# Patient Record
Sex: Male | Born: 1990 | Race: Black or African American | Hispanic: No | Marital: Single | Smoking: Former smoker
Health system: Southern US, Community
[De-identification: ages and names within clinical notes are randomized; demographics above are authoritative.]

## PROBLEM LIST (undated history)

## (undated) DIAGNOSIS — S21339A Puncture wound without foreign body of unspecified front wall of thorax with penetration into thoracic cavity, initial encounter: Secondary | ICD-10-CM

## (undated) DIAGNOSIS — W3400XA Accidental discharge from unspecified firearms or gun, initial encounter: Secondary | ICD-10-CM

## (undated) DIAGNOSIS — F419 Anxiety disorder, unspecified: Secondary | ICD-10-CM

## (undated) HISTORY — DX: Puncture wound without foreign body of unspecified front wall of thorax with penetration into thoracic cavity, initial encounter: S21.339A

## (undated) HISTORY — DX: Anxiety disorder, unspecified: F41.9

## (undated) HISTORY — DX: Accidental discharge from unspecified firearms or gun, initial encounter: W34.00XA

---
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
## 2011-04-21 ENCOUNTER — Emergency Department (HOSPITAL_COMMUNITY): Payer: Medicaid Other

## 2011-04-21 ENCOUNTER — Inpatient Hospital Stay (HOSPITAL_COMMUNITY)
Admission: EM | Admit: 2011-04-21 | Discharge: 2011-05-02 | DRG: 164 | Disposition: A | Discharge status: Home / Self Care | Payer: Medicaid Other | Attending: General Surgery | Admitting: General Surgery

## 2011-04-21 ENCOUNTER — Inpatient Hospital Stay (HOSPITAL_COMMUNITY): Payer: Medicaid Other

## 2011-04-21 DIAGNOSIS — S270XXA Traumatic pneumothorax, initial encounter: Secondary | ICD-10-CM

## 2011-04-21 DIAGNOSIS — S21109A Unspecified open wound of unspecified front wall of thorax without penetration into thoracic cavity, initial encounter: Secondary | ICD-10-CM

## 2011-04-21 DIAGNOSIS — S41131A Puncture wound without foreign body of right upper arm, initial encounter: Secondary | ICD-10-CM

## 2011-04-21 DIAGNOSIS — D62 Acute posthemorrhagic anemia: Secondary | ICD-10-CM | POA: Diagnosis not present

## 2011-04-21 DIAGNOSIS — S51809A Unspecified open wound of unspecified forearm, initial encounter: Secondary | ICD-10-CM | POA: Diagnosis present

## 2011-04-21 DIAGNOSIS — R079 Chest pain, unspecified: Secondary | ICD-10-CM

## 2011-04-21 DIAGNOSIS — G8911 Acute pain due to trauma: Secondary | ICD-10-CM

## 2011-04-21 DIAGNOSIS — S2190XA Unspecified open wound of unspecified part of thorax, initial encounter: Principal | ICD-10-CM | POA: Diagnosis present

## 2011-04-21 DIAGNOSIS — S41109A Unspecified open wound of unspecified upper arm, initial encounter: Secondary | ICD-10-CM

## 2011-04-21 DIAGNOSIS — Y998 Other external cause status: Secondary | ICD-10-CM

## 2011-04-21 LAB — TYPE AND SCREEN
ABO/RH(D): O POS
Antibody Screen: NEGATIVE
Unit division: 0
Unit division: 0

## 2011-04-21 LAB — CBC
HCT: 31.3 % — ABNORMAL LOW (ref 39.0–52.0)
Hemoglobin: 10.3 g/dL — ABNORMAL LOW (ref 13.0–17.0)
MCH: 28.6 pg (ref 26.0–34.0)
MCHC: 32.9 g/dL (ref 30.0–36.0)
MCV: 86.9 fL (ref 78.0–100.0)
RDW: 14.3 % (ref 11.5–15.5)

## 2011-04-21 LAB — ABO/RH: ABO/RH(D): O POS

## 2011-04-21 LAB — URINALYSIS, ROUTINE W REFLEX MICROSCOPIC
Bilirubin Urine: NEGATIVE
Hgb urine dipstick: NEGATIVE
Ketones, ur: NEGATIVE mg/dL
Specific Gravity, Urine: 1.041 — ABNORMAL HIGH (ref 1.005–1.030)
Urobilinogen, UA: 0.2 mg/dL (ref 0.0–1.0)

## 2011-04-21 LAB — POCT I-STAT, CHEM 8
BUN: 9 mg/dL (ref 6–23)
Calcium, Ion: 1.08 mmol/L — ABNORMAL LOW (ref 1.12–1.32)
Chloride: 106 meq/L (ref 96–112)
Glucose, Bld: 127 mg/dL — ABNORMAL HIGH (ref 70–99)
TCO2: 18 mmol/L (ref 0–100)

## 2011-04-21 LAB — COMPREHENSIVE METABOLIC PANEL
ALT: 14 U/L (ref 0–53)
AST: 21 U/L (ref 0–37)
Alkaline Phosphatase: 76 U/L (ref 39–117)
CO2: 20 mEq/L (ref 19–32)
Calcium: 8.3 mg/dL — ABNORMAL LOW (ref 8.4–10.5)
Chloride: 107 mEq/L (ref 96–112)
GFR calc non Af Amer: >90 mL/min (ref >90)
Potassium: 2.6 mEq/L — CL (ref 3.5–5.1)
Sodium: 142 mEq/L (ref 135–145)
Total Bilirubin: 0.2 mg/dL — ABNORMAL LOW (ref 0.3–1.2)

## 2011-04-21 MED ORDER — IOHEXOL 300 MG/ML  SOLN
100.0000 mL | Freq: Once | INTRAMUSCULAR | Status: AC | PRN
  Administered 2011-04-21: 100 mL via INTRAVENOUS

## 2011-04-22 ENCOUNTER — Inpatient Hospital Stay (HOSPITAL_COMMUNITY): Payer: Medicaid Other

## 2011-04-22 LAB — CBC
HCT: 27.6 % — ABNORMAL LOW (ref 39.0–52.0)
MCV: 87.1 fL (ref 78.0–100.0)
RDW: 14.5 % (ref 11.5–15.5)
WBC: 8.5 10*3/uL (ref 4.0–10.5)

## 2011-04-23 ENCOUNTER — Inpatient Hospital Stay (HOSPITAL_COMMUNITY): Payer: Medicaid Other

## 2011-04-23 LAB — CBC
HCT: 27.1 % — ABNORMAL LOW (ref 39.0–52.0)
Hemoglobin: 9 g/dL — ABNORMAL LOW (ref 13.0–17.0)
MCH: 29.2 pg (ref 26.0–34.0)
MCHC: 33.2 g/dL (ref 30.0–36.0)

## 2011-04-24 ENCOUNTER — Inpatient Hospital Stay (HOSPITAL_COMMUNITY): Payer: Medicaid Other

## 2011-04-25 ENCOUNTER — Inpatient Hospital Stay (HOSPITAL_COMMUNITY): Payer: Medicaid Other

## 2011-04-25 LAB — CBC
MCH: 29.8 pg (ref 26.0–34.0)
MCV: 84.7 fL (ref 78.0–100.0)
Platelets: 350 10*3/uL (ref 150–400)
Platelets: 321 10*3/uL (ref 150–400)
RDW: 13.4 % (ref 11.5–15.5)
RDW: 13.4 % (ref 11.5–15.5)
WBC: 11.7 10*3/uL — ABNORMAL HIGH (ref 4.0–10.5)
WBC: 10.7 10*3/uL — ABNORMAL HIGH (ref 4.0–10.5)

## 2011-04-25 LAB — BASIC METABOLIC PANEL
Chloride: 88 mEq/L — ABNORMAL LOW (ref 96–112)
Creatinine, Ser: 0.73 mg/dL (ref 0.50–1.35)
GFR calc Af Amer: >90 mL/min (ref >90)
Potassium: 5.3 mEq/L — ABNORMAL HIGH (ref 3.5–5.1)
Sodium: 127 mEq/L — ABNORMAL LOW (ref 135–145)

## 2011-04-25 LAB — PROTIME-INR
INR: 1.1 (ref 0.00–1.49)
Prothrombin Time: 14.4 seconds (ref 11.6–15.2)

## 2011-04-25 LAB — APTT: aPTT: 37 seconds (ref 24–37)

## 2011-04-26 ENCOUNTER — Inpatient Hospital Stay (HOSPITAL_COMMUNITY): Payer: Medicaid Other

## 2011-04-26 DIAGNOSIS — J9600 Acute respiratory failure, unspecified whether with hypoxia or hypercapnia: Secondary | ICD-10-CM

## 2011-04-26 DIAGNOSIS — S27899A Unspecified injury of other specified intrathoracic organs, initial encounter: Secondary | ICD-10-CM

## 2011-04-26 DIAGNOSIS — S21309A Unspecified open wound of unspecified front wall of thorax with penetration into thoracic cavity, initial encounter: Secondary | ICD-10-CM

## 2011-04-26 HISTORY — PX: OTHER SURGICAL HISTORY: SHX169

## 2011-04-26 LAB — BASIC METABOLIC PANEL WITH GFR
BUN: 16 mg/dL (ref 6–23)
BUN: 16 mg/dL (ref 6–23)
CO2: 28 meq/L (ref 19–32)
CO2: 27 meq/L (ref 19–32)
Calcium: 9.9 mg/dL (ref 8.4–10.5)
Calcium: 9.6 mg/dL (ref 8.4–10.5)
Chloride: 87 meq/L — ABNORMAL LOW (ref 96–112)
Chloride: 89 meq/L — ABNORMAL LOW (ref 96–112)
Creatinine, Ser: 0.81 mg/dL (ref 0.50–1.35)
Creatinine, Ser: 0.8 mg/dL (ref 0.50–1.35)
GFR calc Af Amer: >90 mL/min
GFR calc Af Amer: >90 mL/min
GFR calc non Af Amer: >90 mL/min
GFR calc non Af Amer: >90 mL/min
Glucose, Bld: 124 mg/dL — ABNORMAL HIGH (ref 70–99)
Glucose, Bld: 121 mg/dL — ABNORMAL HIGH (ref 70–99)
Potassium: 6.7 meq/L (ref 3.5–5.1)
Potassium: 5.8 meq/L — ABNORMAL HIGH (ref 3.5–5.1)
Sodium: 124 meq/L — ABNORMAL LOW (ref 135–145)
Sodium: 126 meq/L — ABNORMAL LOW (ref 135–145)

## 2011-04-26 LAB — CBC
HCT: 29.4 % — ABNORMAL LOW (ref 39.0–52.0)
Hemoglobin: 10.4 g/dL — ABNORMAL LOW (ref 13.0–17.0)
RBC: 3.52 MIL/uL — ABNORMAL LOW (ref 4.22–5.81)
RDW: 13.4 % (ref 11.5–15.5)
WBC: 9.9 10*3/uL (ref 4.0–10.5)

## 2011-04-26 LAB — POCT I-STAT 4, (NA,K, GLUC, HGB,HCT)
Glucose, Bld: 130 mg/dL — ABNORMAL HIGH (ref 70–99)
HCT: 25 % — ABNORMAL LOW (ref 39.0–52.0)
Hemoglobin: 8.5 g/dL — ABNORMAL LOW (ref 13.0–17.0)
Potassium: 5.2 meq/L — ABNORMAL HIGH (ref 3.5–5.1)
Sodium: 129 meq/L — ABNORMAL LOW (ref 135–145)

## 2011-04-26 LAB — POTASSIUM: Potassium: 4.9 mEq/L (ref 3.5–5.1)

## 2011-04-27 ENCOUNTER — Inpatient Hospital Stay (HOSPITAL_COMMUNITY): Payer: Medicaid Other

## 2011-04-27 DIAGNOSIS — S272XXA Traumatic hemopneumothorax, initial encounter: Secondary | ICD-10-CM | POA: Diagnosis present

## 2011-04-27 DIAGNOSIS — S2190XA Unspecified open wound of unspecified part of thorax, initial encounter: Secondary | ICD-10-CM | POA: Diagnosis present

## 2011-04-27 LAB — CBC
HCT: 23.7 % — ABNORMAL LOW (ref 39.0–52.0)
Hemoglobin: 8.1 g/dL — ABNORMAL LOW (ref 13.0–17.0)
MCH: 28.4 pg (ref 26.0–34.0)
MCV: 83.2 fL (ref 78.0–100.0)
Platelets: 306 10*3/uL (ref 150–400)
RBC: 2.85 MIL/uL — ABNORMAL LOW (ref 4.22–5.81)
WBC: 13.2 10*3/uL — ABNORMAL HIGH (ref 4.0–10.5)

## 2011-04-27 LAB — BASIC METABOLIC PANEL
BUN: 6 mg/dL (ref 6–23)
CO2: 26 mEq/L (ref 19–32)
Calcium: 8.4 mg/dL (ref 8.4–10.5)
Chloride: 91 mEq/L — ABNORMAL LOW (ref 96–112)
Creatinine, Ser: 0.57 mg/dL (ref 0.50–1.35)
Glucose, Bld: 138 mg/dL — ABNORMAL HIGH (ref 70–99)

## 2011-04-27 LAB — POCT I-STAT 3, ART BLOOD GAS (G3+)
Acid-Base Excess: 3 mmol/L — ABNORMAL HIGH (ref 0.0–2.0)
Bicarbonate: 27.9 meq/L — ABNORMAL HIGH (ref 20.0–24.0)
pCO2 arterial: 43.3 mmHg (ref 35.0–45.0)
pO2, Arterial: 124 mmHg — ABNORMAL HIGH (ref 80.0–100.0)

## 2011-04-27 MED ORDER — ALBUTEROL SULFATE HFA 108 (90 BASE) MCG/ACT IN AERS
4.0000 | INHALATION_SPRAY | Freq: Four times a day (QID) | RESPIRATORY_TRACT | Status: AC
  Administered 2011-04-28 (×2): 4 via RESPIRATORY_TRACT

## 2011-04-27 MED ORDER — SODIUM CHLORIDE 0.9 % IJ SOLN
10.0000 mL | Freq: Two times a day (BID) | INTRAMUSCULAR | Status: DC
  Administered 2011-04-28: 10 mL via INTRAVENOUS
  Administered 2011-04-29: 3 mL via INTRAVENOUS
  Administered 2011-04-29: 10 mL via INTRAVENOUS
  Administered 2011-04-30 – 2011-05-01 (×3): 3 mL via INTRAVENOUS
  Administered 2011-05-01: 10 mL via INTRAVENOUS
  Filled 2011-04-27 (×4): qty 10

## 2011-04-27 MED ORDER — PANTOPRAZOLE SODIUM 40 MG IV SOLR: 40.0000 mg | Freq: Every day | INTRAVENOUS | Status: DC | PRN

## 2011-04-27 MED ORDER — HYDROMORPHONE 0.3 MG/ML IV SOLN: INTRAVENOUS | Status: DC

## 2011-04-27 MED ORDER — ONDANSETRON HCL 4 MG/2ML IJ SOLN: 4.0000 mg | Freq: Four times a day (QID) | INTRAMUSCULAR | Status: DC | PRN

## 2011-04-27 MED ORDER — FENTANYL 10 MCG/ML IV SOLN
INTRAVENOUS | Status: DC
  Administered 2011-04-28: 80 ug via INTRAVENOUS
  Administered 2011-04-28: 11:00:00 via INTRAVENOUS
  Administered 2011-04-28: 50 ug via INTRAVENOUS
  Administered 2011-04-28: 70 ug via INTRAVENOUS
  Administered 2011-04-28: 30 ug via INTRAVENOUS
  Administered 2011-04-28: 40 ug via INTRAVENOUS
  Administered 2011-04-29: 90 ug via INTRAVENOUS
  Administered 2011-04-29: 05:00:00 via INTRAVENOUS
  Administered 2011-04-29: 190 ug via INTRAVENOUS
  Filled 2011-04-27 (×3): qty 50

## 2011-04-27 MED ORDER — ACETAMINOPHEN 10 MG/ML IV SOLN
1000.0000 mg | Freq: Four times a day (QID) | INTRAVENOUS | Status: AC
  Administered 2011-04-28: 1000 mg via INTRAVENOUS
  Filled 2011-04-27 (×4): qty 100

## 2011-04-27 MED ORDER — SODIUM CHLORIDE 0.9 % IJ SOLN: 10.0000 mL | INTRAMUSCULAR | Status: DC | PRN

## 2011-04-27 MED ORDER — TRAMADOL HCL 50 MG PO TABS: 50.0000 mg | ORAL_TABLET | Freq: Four times a day (QID) | ORAL | Status: DC | PRN

## 2011-04-27 MED ORDER — ONDANSETRON HCL 4 MG PO TABS: 4.0000 mg | ORAL_TABLET | Freq: Four times a day (QID) | ORAL | Status: DC | PRN

## 2011-04-27 MED ORDER — MORPHINE SULFATE 2 MG/ML IJ SOLN: 2.0000 mg | INTRAMUSCULAR | Status: DC | PRN

## 2011-04-27 MED ORDER — SODIUM CHLORIDE 0.9 % IV SOLN: INTRAVENOUS | Status: DC

## 2011-04-27 MED ORDER — POTASSIUM CHLORIDE 10 MEQ/50ML IV SOLN
10.0000 meq | INTRAVENOUS | Status: DC | PRN
  Filled 2011-04-27: qty 50

## 2011-04-27 MED ORDER — NALOXONE HCL 0.4 MG/ML IJ SOLN: 0.4000 mg | INTRAMUSCULAR | Status: DC | PRN

## 2011-04-27 MED ORDER — ACETAMINOPHEN 325 MG PO TABS: 650.0000 mg | ORAL_TABLET | ORAL | Status: DC | PRN

## 2011-04-27 MED ORDER — OXYCODONE HCL 5 MG PO TABS
5.0000 mg | ORAL_TABLET | Freq: Four times a day (QID) | ORAL | Status: DC | PRN
  Administered 2011-04-28 – 2011-04-29 (×3): 5 mg via ORAL
  Filled 2011-04-27 (×2): qty 1

## 2011-04-27 MED ORDER — POLYSACCHARIDE IRON 150 MG PO CAPS
150.0000 mg | ORAL_CAPSULE | Freq: Every day | ORAL | Status: DC
  Administered 2011-04-28 – 2011-05-01 (×5): 150 mg via ORAL
  Filled 2011-04-27 (×5): qty 1

## 2011-04-27 MED ORDER — PANTOPRAZOLE SODIUM 40 MG PO TBEC
40.0000 mg | DELAYED_RELEASE_TABLET | Freq: Every day | ORAL | Status: DC
  Administered 2011-04-28 – 2011-04-29 (×2): 40 mg via ORAL

## 2011-04-27 MED ORDER — ACETAMINOPHEN 325 MG PO TABS: 650.0000 mg | ORAL_TABLET | Freq: Four times a day (QID) | ORAL | Status: DC | PRN

## 2011-04-27 MED ORDER — BACITRACIN-NEOMYCIN-POLYMYXIN OINTMENT TUBE
TOPICAL_OINTMENT | Freq: Every day | CUTANEOUS | Status: DC
  Administered 2011-04-28 – 2011-05-01 (×4): via TOPICAL
  Filled 2011-04-27 (×3): qty 15

## 2011-04-27 MED ORDER — SODIUM CHLORIDE 0.9 % IJ SOLN: 9.0000 mL | INTRAMUSCULAR | Status: DC | PRN

## 2011-04-27 MED ORDER — CELECOXIB 200 MG PO CAPS
200.0000 mg | ORAL_CAPSULE | Freq: Two times a day (BID) | ORAL | Status: DC
  Administered 2011-04-28 – 2011-05-01 (×8): 200 mg via ORAL
  Filled 2011-04-27 (×12): qty 1

## 2011-04-27 MED ORDER — SENNOSIDES-DOCUSATE SODIUM 8.6-50 MG PO TABS
1.0000 | ORAL_TABLET | Freq: Every evening | ORAL | Status: DC | PRN
  Administered 2011-05-01: 1 via ORAL
  Filled 2011-04-27: qty 1

## 2011-04-27 MED ORDER — HYDROCODONE-ACETAMINOPHEN 5-325 MG PO TABS
1.0000 | ORAL_TABLET | ORAL | Status: DC | PRN
  Administered 2011-04-28 (×2): 1 via ORAL
  Administered 2011-04-28: 2 via ORAL
  Administered 2011-04-29 (×2): 1 via ORAL
  Administered 2011-04-29 – 2011-05-02 (×14): 2 via ORAL
  Filled 2011-04-27 (×5): qty 2
  Filled 2011-04-27: qty 1
  Filled 2011-04-27: qty 2
  Filled 2011-04-27: qty 1
  Filled 2011-04-27 (×4): qty 2

## 2011-04-27 MED ORDER — DIPHENHYDRAMINE HCL 25 MG PO CAPS: 25.0000 mg | ORAL_CAPSULE | Freq: Four times a day (QID) | ORAL | Status: DC | PRN

## 2011-04-27 MED ORDER — DEXTROSE-NACL 5-0.9 % IV SOLN: INTRAVENOUS | Status: DC

## 2011-04-27 MED ORDER — DOCUSATE SODIUM 100 MG PO CAPS
100.0000 mg | ORAL_CAPSULE | Freq: Two times a day (BID) | ORAL | Status: DC
  Administered 2011-04-28 – 2011-05-01 (×6): 100 mg via ORAL
  Filled 2011-04-27 (×7): qty 1

## 2011-04-27 MED ORDER — BISACODYL 5 MG PO TBEC
10.0000 mg | DELAYED_RELEASE_TABLET | Freq: Every day | ORAL | Status: DC
  Administered 2011-04-28 – 2011-05-01 (×4): 10 mg via ORAL
  Filled 2011-04-27 (×2): qty 2

## 2011-04-27 MED ORDER — DIPHENHYDRAMINE HCL 12.5 MG/5ML PO ELIX
25.0000 mg | ORAL_SOLUTION | Freq: Four times a day (QID) | ORAL | Status: DC | PRN
  Filled 2011-04-27: qty 10

## 2011-04-27 MED ORDER — FENTANYL 10 MCG/ML IV SOLN
INTRAVENOUS | Status: DC
  Filled 2011-04-27: qty 50

## 2011-04-27 MED ORDER — METOPROLOL TARTRATE 12.5 MG HALF TABLET
12.5000 mg | ORAL_TABLET | Freq: Two times a day (BID) | ORAL | Status: DC
  Administered 2011-04-28 – 2011-05-01 (×8): 12.5 mg via ORAL
  Filled 2011-04-27: qty 0.5
  Filled 2011-04-27: qty 1
  Filled 2011-04-27: qty 0.5
  Filled 2011-04-27: qty 1
  Filled 2011-04-27: qty 0.5
  Filled 2011-04-27: qty 1
  Filled 2011-04-27: qty 0.5
  Filled 2011-04-27 (×4): qty 1
  Filled 2011-04-27: qty 0.5

## 2011-04-27 MED ORDER — DEXTROSE 5 % IV SOLN
1.5000 g | Freq: Two times a day (BID) | INTRAVENOUS | Status: DC
  Administered 2011-04-28 – 2011-04-29 (×3): 1.5 g via INTRAVENOUS
  Filled 2011-04-27 (×3): qty 1.5

## 2011-04-27 MED ORDER — DIPHENHYDRAMINE HCL 50 MG/ML IJ SOLN
12.5000 mg | Freq: Four times a day (QID) | INTRAMUSCULAR | Status: DC | PRN
  Administered 2011-04-28: 25 mg via INTRAVENOUS
  Filled 2011-04-27: qty 1

## 2011-04-27 MED ORDER — DEXTROSE-NACL 5-0.9 % IV SOLN
INTRAVENOUS | Status: DC
  Administered 2011-04-28 (×3): via INTRAVENOUS
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
## 2011-04-28 ENCOUNTER — Inpatient Hospital Stay (HOSPITAL_COMMUNITY): Payer: Medicaid Other

## 2011-04-28 LAB — CBC
MCHC: 33.8 g/dL (ref 30.0–36.0)
Platelets: 384 10*3/uL (ref 150–400)
RDW: 13.8 % (ref 11.5–15.5)
WBC: 11.1 10*3/uL — ABNORMAL HIGH (ref 4.0–10.5)

## 2011-04-28 LAB — COMPREHENSIVE METABOLIC PANEL
ALT: 25 U/L (ref 0–53)
AST: 57 U/L — ABNORMAL HIGH (ref 0–37)
Albumin: 2.8 g/dL — ABNORMAL LOW (ref 3.5–5.2)
Alkaline Phosphatase: 75 U/L (ref 39–117)
Potassium: 4.3 mEq/L (ref 3.5–5.1)
Sodium: 132 mEq/L — ABNORMAL LOW (ref 135–145)
Total Protein: 7.7 g/dL (ref 6.0–8.3)
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
## 2011-04-28 NOTE — Progress Notes (Signed)
                                                  Subjective: The patient feels much better today. He has no air leak. Chest x-ray shows lung is expanded. He is going small column. He is less nauseated today. We plan to transfer him today and discontinue one chest.  Objective: Vital signs in last 24 hours: Temp:  [98.6 F (37 C)-99 F (37.2 C)] 98.6 F (37 C) (11/04 0400) Cardiac Rhythm:  [-] Normal sinus rhythm (11/04 0600) SpO2:  [100 %] 100 % (11/04 0815) Weight:  [137 lb 9.1 oz (62.4 kg)] 137 lb 9.1 oz (62.4 kg) (11/04 0600)  Hemodynamic parameters for last 24 hours:    Intake/Output from previous day: 11/03 0701 - 11/04 0700 In: 743 [P.O.:180; I.V.:563] Out: 930 [Urine:880; Chest Tube:50] Intake/Output this shift:    Heart: regular rate and rhythm, S1, S2 normal, no murmur, click, rub or gallop Lungs: clear to auscultation bilaterally  Lab Results:  [facility] 04/28/11 0340 04/27/11 0344  WBC 11.1* 13.2*  HGB 8.9* 8.1*  HCT 26.3* 23.7*  PLT 384 306   BMET:  Basename 04/28/11 0340 04/27/11 0344  NA 132* 126*  K 4.3 4.4  CL 95* 91*  CO2 28 26  GLUCOSE 94 138*  BUN 9 6  CREATININE 0.63 0.57  CALCIUM 9.4 8.4    PT/INR:  Basename 04/25/11 1708  LABPROT 14.4  INR 1.10   ABG    Component Value Date/Time   PHART 7.416 04/27/2011 0417   HCO3 27.9* 04/27/2011 0417   TCO2 29 04/27/2011 0417   O2SAT 99.0 04/27/2011 0417   CBG (last 3)  No results found for this basename: GLUCAP:3 in the last 72 hours  Assessment/Plan: S/P   The patient will be transferred today. We will remove one chest tube. We removed his on Q.   LOS: 7 days    Batsheva Stevick [facility] 04/28/2011

## 2011-04-28 NOTE — Plan of Care (Signed)
Problem: Phase I Progression Outcomes Goal: If Diabetic, blood sugar < 150 Outcome: Not Applicable Date Met:  04/28/11 NA

## 2011-04-29 ENCOUNTER — Inpatient Hospital Stay (HOSPITAL_COMMUNITY): Payer: Medicaid Other

## 2011-04-29 LAB — TYPE AND SCREEN

## 2011-04-29 LAB — CBC
HCT: 25.7 % — ABNORMAL LOW (ref 39.0–52.0)
Hemoglobin: 8.7 g/dL — ABNORMAL LOW (ref 13.0–17.0)
MCH: 28.4 pg (ref 26.0–34.0)
MCHC: 33.9 g/dL (ref 30.0–36.0)
MCV: 84 fL (ref 78.0–100.0)
Platelets: 529 10*3/uL — ABNORMAL HIGH (ref 150–400)
RBC: 3.06 MIL/uL — ABNORMAL LOW (ref 4.22–5.81)
RDW: 14 % (ref 11.5–15.5)
WBC: 11.8 10*3/uL — ABNORMAL HIGH (ref 4.0–10.5)

## 2011-04-29 LAB — CULTURE, RESPIRATORY W GRAM STAIN
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
## 2011-04-29 NOTE — Progress Notes (Signed)
                                                  Subjective: The patient had a temp of 101 last night. Patient has no air leak. Chest x-ray shows a apical space. He is doing well overall. I plan to start antibiotics. I will try and transfer him again.  Objective: Vital signs in last 24 hours: Temp:  [98.4 F (36.9 C)-101.1 F (38.4 C)] 98.4 F (36.9 C) (11/05 0800) Pulse Rate:  [96-119] 111  (11/05 0800) Cardiac Rhythm:  [-] Sinus tachycardia (11/05 0800) Resp:  [11-21] 17  (11/05 0800) BP: (108-150)/(65-103) 126/73 mmHg (11/05 0800) SpO2:  [92 %-100 %] 97 % (11/05 0800) Weight:  [135 lb 9.3 oz (61.5 kg)] 135 lb 9.3 oz (61.5 kg) (11/05 0600)  Hemodynamic parameters for last 24 hours:    Intake/Output from previous day: 11/04 0701 - 11/05 0700 In: 2135 [P.O.:1450; I.V.:585; IV Piggyback:100] Out: 1540 [Urine:1000; Chest Tube:540] Intake/Output this shift:    Heart: regular rate and rhythm, S1, S2 normal, no murmur, click, rub or gallop Lungs: clear to auscultation bilaterally  Lab Results:  Basename 04/29/11 0600 04/28/11 0340  WBC 11.8* 11.1*  HGB 8.7* 8.9*  HCT 25.7* 26.3*  PLT 529* 384   BMET:  Basename 04/28/11 0340 04/27/11 0344  NA 132* 126*  K 4.3 4.4  CL 95* 91*  CO2 28 26  GLUCOSE 94 138*  BUN 9 6  CREATININE 0.63 0.57  CALCIUM 9.4 8.4    PT/INR: No results found for this basename: LABPROT,INR in the last 72 hours ABG    Component Value Date/Time   PHART 7.416 04/27/2011 0417   HCO3 27.9* 04/27/2011 0417   TCO2 29 04/27/2011 0417   O2SAT 99.0 04/27/2011 0417   CBG (last 3)  No results found for this basename: GLUCAP:3 in the last 72 hours  Assessment/Plan: S/P   Plan for transfer to step-down: see transfer orders. I will start antibiotics.   LOS: 8 days    Ocie Stanzione PAT[facility] 04/29/2011

## 2011-04-29 NOTE — Progress Notes (Signed)
Transferred to 2041 via wheelchair.  Portable monitor on.  No changes.

## 2011-04-30 ENCOUNTER — Inpatient Hospital Stay (HOSPITAL_COMMUNITY): Payer: Medicaid Other

## 2011-04-30 DIAGNOSIS — D62 Acute posthemorrhagic anemia: Secondary | ICD-10-CM | POA: Diagnosis not present

## 2011-04-30 LAB — URINALYSIS, ROUTINE W REFLEX MICROSCOPIC
Glucose, UA: NEGATIVE mg/dL
Hgb urine dipstick: NEGATIVE
Protein, ur: 30 mg/dL — AB
Specific Gravity, Urine: 1.039 — ABNORMAL HIGH (ref 1.005–1.030)

## 2011-04-30 LAB — BASIC METABOLIC PANEL
BUN: 9 mg/dL (ref 6–23)
Calcium: 9.4 mg/dL (ref 8.4–10.5)
Creatinine, Ser: 0.57 mg/dL (ref 0.50–1.35)
GFR calc Af Amer: >90 mL/min (ref >90)
GFR calc non Af Amer: >90 mL/min (ref >90)

## 2011-04-30 LAB — URINE MICROSCOPIC-ADD ON

## 2011-04-30 LAB — CBC
MCHC: 33.8 g/dL (ref 30.0–36.0)
RDW: 13.7 % (ref 11.5–15.5)

## 2011-04-30 MED ORDER — MORPHINE SULFATE 2 MG/ML IJ SOLN: 2.0000 mg | INTRAMUSCULAR | Status: DC | PRN

## 2011-04-30 MED ORDER — POLYETHYLENE GLYCOL 3350 17 G PO PACK
17.0000 g | PACK | Freq: Every day | ORAL | Status: DC
  Administered 2011-05-01: 17 g via ORAL
  Filled 2011-04-30 (×3): qty 1
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
## 2011-04-30 NOTE — Progress Notes (Signed)
Hgb=7.1 Dr. Edwyna Shell aware.

## 2011-04-30 NOTE — Progress Notes (Signed)
                                                  Subjective: The patient's incision is well healed. Chest x-ray shows and the lungs expanded. He has no air leak. Plan to remove chest tube. We will discharge tomorrow if stable. Hematocrit was 23. Patient is on iron.  Objective: Vital signs in last 24 hours: Temp:  [98.4 F (36.9 C)-99.2 F (37.3 C)] 98.4 F (36.9 C) (11/06 0500) Pulse Rate:  [82-104] 82  (11/06 0500) Cardiac Rhythm:  [-] Normal sinus rhythm (11/06 0700) Resp:  [14-18] 14  (11/06 0500) BP: (118-126)/(61-74) 126/61 mmHg (11/06 0500) SpO2:  [93 %-97 %] 97 % (11/06 0500)  Hemodynamic parameters for last 24 hours:    Intake/Output from previous day: 11/05 0701 - 11/06 0700 In: 651 [P.O.:600; I.V.:51] Out: 1465 [Urine:1465] Intake/Output this shift: Total I/O In: 240 [P.O.:240] Out: -   Heart: regular rate and rhythm, S1, S2 normal, no murmur, click, rub or gallop Lungs: clear to auscultation bilaterally  Lab Results:  Basename 04/30/11 0550 04/29/11 0600  WBC 9.5 11.8*  HGB 7.8* 8.7*  HCT 23.1* 25.7*  PLT 554* 529*   BMET:  Basename 04/30/11 0550 04/28/11 0340  NA 134* 132*  K 3.5 4.3  CL 96 95*  CO2 29 28  GLUCOSE 107* 94  BUN 9 9  CREATININE 0.57 0.63  CALCIUM 9.4 9.4    PT/INR: No results found for this basename: LABPROT,INR in the last 72 hours ABG    Component Value Date/Time   PHART 7.416 04/27/2011 0417   HCO3 27.9* 04/27/2011 0417   TCO2 29 04/27/2011 0417   O2SAT 99.0 04/27/2011 0417   CBG (last 3)  No results found for this basename: GLUCAP:3 in the last 72 hours  Assessment/Plan: S/P   Plan to remove chest tube. Will discharge tomorrow if stable. Continue iron for anemia.   LOS: 9 days    Artyom Stencel PAT[facility] 04/30/2011

## 2011-05-01 ENCOUNTER — Encounter (HOSPITAL_COMMUNITY): Payer: Self-pay | Admitting: *Deleted

## 2011-05-01 ENCOUNTER — Inpatient Hospital Stay (HOSPITAL_COMMUNITY): Payer: Medicaid Other

## 2011-05-01 LAB — CBC
HCT: 21.9 % — ABNORMAL LOW (ref 39.0–52.0)
MCHC: 33.8 g/dL (ref 30.0–36.0)
MCV: 83.6 fL (ref 78.0–100.0)
RDW: 13.8 % (ref 11.5–15.5)
WBC: 10.2 10*3/uL (ref 4.0–10.5)

## 2011-05-01 LAB — URINE CULTURE
Colony Count: NO GROWTH
Culture: NO GROWTH

## 2011-05-01 MED ORDER — POTASSIUM CHLORIDE CRYS ER 20 MEQ PO TBCR
40.0000 meq | EXTENDED_RELEASE_TABLET | Freq: Once | ORAL | Status: AC
  Administered 2011-05-01: 40 meq via ORAL

## 2011-05-01 MED ORDER — THERA M PLUS PO TABS
1.0000 | ORAL_TABLET | Freq: Every day | ORAL | Status: DC
  Administered 2011-05-01: 1 via ORAL
  Filled 2011-05-01 (×2): qty 1

## 2011-05-01 MED ORDER — FERROUS SULFATE 325 (65 FE) MG PO TABS
325.0000 mg | ORAL_TABLET | Freq: Two times a day (BID) | ORAL | Status: DC
  Administered 2011-05-01 – 2011-05-02 (×3): 325 mg via ORAL
  Filled 2011-05-01 (×5): qty 1

## 2011-05-01 MED ORDER — HYDROCODONE-ACETAMINOPHEN 5-325 MG PO TABS: 1.0000 | ORAL_TABLET | ORAL | Status: AC | PRN

## 2011-05-01 MED ORDER — METOPROLOL TARTRATE 12.5 MG HALF TABLET: 12.5000 mg | ORAL_TABLET | Freq: Two times a day (BID) | ORAL | Status: DC

## 2011-05-01 MED ORDER — POLYSACCHARIDE IRON 150 MG PO CAPS: 150.0000 mg | ORAL_CAPSULE | Freq: Every day | ORAL | Status: DC
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
## 2011-05-01 NOTE — Progress Notes (Signed)
                                                  Subjective: Chest x-ray shows that the anterior space for possible pneumothorax the patient is asymptomatic. We will watch this and repeat his chest x-ray tomorrow we plan to remove some of his skin clips. He is afebrile and looks very good. Objective: Vital signs in last 24 hours: Temp:  [98.1 F (36.7 C)-98.2 F (36.8 C)] 98.2 F (36.8 C) (11/07 0503) Pulse Rate:  [79-111] 86  (11/07 0503) Cardiac Rhythm:  [-] Normal sinus rhythm (11/07 0810) Resp:  [14-18] 14  (11/07 0503) BP: (105-125)/(63-78) 105/63 mmHg (11/07 0503) SpO2:  [91 %-98 %] 98 % (11/07 0503) Weight:  [60.9 kg (134 lb 4.2 oz)] 134 lb 4.2 oz (60.9 kg) (11/07 0503)  Hemodynamic parameters for last 24 hours:    Intake/Output from previous day: 11/06 0701 - 11/07 0700 In: 720 [P.O.:720] Out: 1475 [Urine:1475] Intake/Output this shift:    Heart: regular rate and rhythm, S1, S2 normal, no murmur, click, rub or gallop Lungs: clear to auscultation bilaterally  Lab Results:  Basename 05/01/11 0500 04/30/11 0550  WBC 10.2 9.5  HGB 7.4* 7.8*  HCT 21.9* 23.1*  PLT 601* 554*   BMET:  Basename 04/30/11 0550  NA 134*  K 3.5  CL 96  CO2 29  GLUCOSE 107*  BUN 9  CREATININE 0.57  CALCIUM 9.4    PT/INR: No results found for this basename: LABPROT,INR in the last 72 hours ABG    Component Value Date/Time   PHART 7.416 04/27/2011 0417   HCO3 27.9* 04/27/2011 0417   TCO2 29 04/27/2011 0417   O2SAT 99.0 04/27/2011 0417   CBG (last 3)  No results found for this basename: GLUCAP:3 in the last 72 hours  Assessment/Plan: S/P   Plan to repeat his chest x-ray had watching for any signs of the dyspnea. We will discontinue every other clip   LOS: 10 days    Raegen Tarpley PATRICK 05/01/2011

## 2011-05-01 NOTE — Progress Notes (Signed)
This patient was discussed at long LOS rounds this morning. 
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
## 2011-05-02 ENCOUNTER — Inpatient Hospital Stay (HOSPITAL_COMMUNITY): Payer: Medicaid Other
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
## 2011-05-02 NOTE — Discharge Summary (Signed)
  Dr. Scheryl Darter service took over as primary/attending service after surgery and DC Summary was done by Coral Ceo, PA-C. Please see this summary.
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
## 2011-05-06 ENCOUNTER — Encounter: Payer: Self-pay | Admitting: Thoracic Surgery

## 2011-05-07 ENCOUNTER — Encounter: Payer: Self-pay | Admitting: *Deleted

## 2011-05-07 ENCOUNTER — Other Ambulatory Visit: Payer: Self-pay | Admitting: Thoracic Surgery

## 2011-05-07 DIAGNOSIS — S21309A Unspecified open wound of unspecified front wall of thorax with penetration into thoracic cavity, initial encounter: Secondary | ICD-10-CM

## 2011-05-09 ENCOUNTER — Encounter: Payer: Self-pay | Admitting: Thoracic Surgery

## 2011-05-09 ENCOUNTER — Encounter: Payer: Self-pay | Admitting: *Deleted

## 2011-05-09 ENCOUNTER — Ambulatory Visit
Admission: RE | Admit: 2011-05-09 | Discharge: 2011-05-09 | Disposition: A | Discharge status: Home / Self Care | Payer: BC Managed Care – PPO | Source: Ambulatory Visit | Attending: Thoracic Surgery | Admitting: Thoracic Surgery

## 2011-05-09 ENCOUNTER — Ambulatory Visit (INDEPENDENT_AMBULATORY_CARE_PROVIDER_SITE_OTHER): Payer: Self-pay | Admitting: Thoracic Surgery

## 2011-05-09 VITALS — BP 109/68 | HR 79 | Resp 16 | Ht 71.0 in | Wt 135.0 lb

## 2011-05-09 DIAGNOSIS — S271XXA Traumatic hemothorax, initial encounter: Secondary | ICD-10-CM

## 2011-05-09 DIAGNOSIS — S21309A Unspecified open wound of unspecified front wall of thorax with penetration into thoracic cavity, initial encounter: Secondary | ICD-10-CM
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
## 2011-05-15 ENCOUNTER — Encounter: Payer: Self-pay | Admitting: *Deleted

## 2011-05-20 ENCOUNTER — Other Ambulatory Visit: Payer: Self-pay | Admitting: Thoracic Surgery

## 2011-05-20 DIAGNOSIS — S27899A Unspecified injury of other specified intrathoracic organs, initial encounter: Secondary | ICD-10-CM

## 2011-05-22 ENCOUNTER — Ambulatory Visit (INDEPENDENT_AMBULATORY_CARE_PROVIDER_SITE_OTHER): Payer: Self-pay | Admitting: Thoracic Surgery

## 2011-05-22 ENCOUNTER — Ambulatory Visit
Admission: RE | Admit: 2011-05-22 | Discharge: 2011-05-22 | Disposition: A | Discharge status: Home / Self Care | Payer: BC Managed Care – PPO | Source: Ambulatory Visit | Attending: Thoracic Surgery | Admitting: Thoracic Surgery

## 2011-05-22 ENCOUNTER — Encounter: Payer: Self-pay | Admitting: Thoracic Surgery

## 2011-05-22 VITALS — BP 122/68 | HR 74 | Resp 16

## 2011-05-22 DIAGNOSIS — S21309A Unspecified open wound of unspecified front wall of thorax with penetration into thoracic cavity, initial encounter: Secondary | ICD-10-CM

## 2011-05-22 DIAGNOSIS — S271XXA Traumatic hemothorax, initial encounter: Secondary | ICD-10-CM
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
## 2011-07-01 ENCOUNTER — Other Ambulatory Visit: Payer: Self-pay | Admitting: Thoracic Surgery

## 2011-07-01 DIAGNOSIS — S21309A Unspecified open wound of unspecified front wall of thorax with penetration into thoracic cavity, initial encounter: Secondary | ICD-10-CM

## 2011-07-03 ENCOUNTER — Ambulatory Visit: Payer: Self-pay | Admitting: Thoracic Surgery

## 2011-07-05 ENCOUNTER — Other Ambulatory Visit: Payer: Self-pay | Admitting: Thoracic Surgery

## 2011-07-05 DIAGNOSIS — S27899A Unspecified injury of other specified intrathoracic organs, initial encounter: Secondary | ICD-10-CM

## 2011-07-09 ENCOUNTER — Ambulatory Visit
Admission: RE | Admit: 2011-07-09 | Discharge: 2011-07-09 | Disposition: A | Discharge status: Home / Self Care | Payer: BC Managed Care – PPO | Source: Ambulatory Visit | Attending: Thoracic Surgery | Admitting: Thoracic Surgery

## 2011-07-09 ENCOUNTER — Ambulatory Visit (INDEPENDENT_AMBULATORY_CARE_PROVIDER_SITE_OTHER): Payer: Self-pay | Admitting: Thoracic Surgery

## 2011-07-09 ENCOUNTER — Encounter: Payer: Self-pay | Admitting: Thoracic Surgery

## 2011-07-09 VITALS — BP 124/72 | HR 61 | Resp 16 | Ht 71.0 in | Wt 135.0 lb

## 2011-07-09 DIAGNOSIS — S21309A Unspecified open wound of unspecified front wall of thorax with penetration into thoracic cavity, initial encounter: Secondary | ICD-10-CM

## 2011-07-09 DIAGNOSIS — S271XXA Traumatic hemothorax, initial encounter: Secondary | ICD-10-CM
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
## 2011-10-16 ENCOUNTER — Encounter (HOSPITAL_COMMUNITY): Payer: Self-pay | Admitting: *Deleted

## 2011-10-16 ENCOUNTER — Emergency Department (HOSPITAL_COMMUNITY)
Admission: EM | Admit: 2011-10-16 | Discharge: 2011-10-16 | Disposition: A | Discharge status: Home / Self Care | Payer: BC Managed Care – PPO | Attending: Emergency Medicine | Admitting: Emergency Medicine

## 2011-10-16 DIAGNOSIS — R51 Headache: Secondary | ICD-10-CM | POA: Insufficient documentation

## 2011-10-16 DIAGNOSIS — J3489 Other specified disorders of nose and nasal sinuses: Secondary | ICD-10-CM | POA: Insufficient documentation

## 2011-10-16 MED ORDER — AZITHROMYCIN 250 MG PO TABS: ORAL_TABLET | ORAL | Status: AC

## 2011-10-16 NOTE — ED Notes (Signed)
Patient reports he has had a headache and weakness/dizziness for a couple of days.  He denies fever, denies sore throat, denies rash

## 2011-10-16 NOTE — ED Provider Notes (Signed)
History     CSN: [id number]  Arrival date & time 10/16/11  1024   First MD Initiated Contact with Patient 10/16/11 1109      Chief Complaint  Patient presents with  . Headache  . Weakness    (Consider location/radiation/quality/duration/timing/severity/associated sxs/prior treatment) HPI Comments: Patient comes in today with a chief complaint of a frontal headache for the past 2 days.  He describes the pain as pressure.  Pain does not radiate.  He reports that he frequently gets headaches similar to this in the spring when the pollen is increased.  He took a decongestant yesterday and nasal spray, which helped the headache.  He reports that he has also had some rhinorrhea and nasal congestion over the past couple of days.  He denies any fever, chills, neck pain or stiffness, lightheadedness, dizziness, visual changes, or vomiting.  No head trauma or injury.  Patient is a 21 y.o. male presenting with headaches. The history is provided by the patient.  Headache  The headache is associated with nothing. Pertinent negatives include no fever, no near-syncope, no syncope, no shortness of breath, no nausea and no vomiting.    History reviewed. No pertinent past medical history.  Past Surgical History  Procedure Date  . Left vats,fiberoptic bronch 04/26/11    History reviewed. No pertinent family history.  History  Substance Use Topics  . Smoking status: Never Smoker   . Smokeless tobacco: Not on file  . Alcohol Use: No      Review of Systems  Constitutional: Negative for fever and chills.  HENT: Positive for congestion, rhinorrhea, sneezing, postnasal drip and sinus pressure. Negative for ear pain, sore throat, drooling, neck pain and neck stiffness.   Eyes: Negative for visual disturbance.  Respiratory: Negative for shortness of breath.   Cardiovascular: Negative for syncope and near-syncope.  Gastrointestinal: Negative for nausea and vomiting.  Musculoskeletal: Negative for  gait problem.  Skin: Negative for rash.  Neurological: Positive for headaches. Negative for dizziness, syncope, light-headedness and numbness.  Psychiatric/Behavioral: Negative for confusion.    Allergies  Review of patient's allergies indicates no known allergies.  Home Medications  No current outpatient prescriptions on file.  BP 127/87  Pulse 79  Temp(Src) 98.4 F (36.9 C) (Oral)  Resp 14  SpO2 99%  Physical Exam  Nursing note and vitals reviewed. Constitutional: He appears well-developed and well-nourished. No distress.  HENT:  Head: Normocephalic and atraumatic.  Right Ear: Hearing, tympanic membrane, external ear and ear canal normal.  Left Ear: Hearing, tympanic membrane, external ear and ear canal normal.  Nose: Mucosal edema and rhinorrhea present. Right sinus exhibits frontal sinus tenderness. Left sinus exhibits frontal sinus tenderness.  Mouth/Throat: Uvula is midline, oropharynx is clear and moist and mucous membranes are normal.  Eyes: EOM are normal. Pupils are equal, round, and reactive to light.  Neck: Normal range of motion. Neck supple.  Cardiovascular: Normal rate, regular rhythm and normal heart sounds.   Pulmonary/Chest: Effort normal and breath sounds normal.  Neurological: He is alert. He has normal strength. No cranial nerve deficit. He displays a negative Romberg sign. Gait normal.  Skin: Skin is warm and dry. No rash noted. He is not diaphoretic.    ED Course  Procedures (including critical care time)  Labs Reviewed - No data to display No results found.   No diagnosis found.    MDM  Signs and symptoms consistent with Acute Sinusitis.   Presentation non concerning for [facility], ICH, Meningitis, or temporal  arteritis. Pt is afebrile with no focal neuro deficits, nuchal rigidity, or change in vision.  Patient given antibiotic for Sinusitis.  Pt verbalizes understanding and is agreeable with plan to dc.         Pascal Lux [place],  PA-C 10/16/11 [phone]

## 2011-10-16 NOTE — ED Provider Notes (Signed)
Medical screening examination/treatment/procedure(s) were performed by non-physician practitioner and as supervising physician I was immediately available for consultation/collaboration.  Treshun Wold, MD 10/16/11 1928 

## 2011-11-04 ENCOUNTER — Other Ambulatory Visit: Payer: Self-pay | Admitting: Thoracic Surgery

## 2011-11-04 DIAGNOSIS — S21309A Unspecified open wound of unspecified front wall of thorax with penetration into thoracic cavity, initial encounter: Secondary | ICD-10-CM

## 2011-11-06 ENCOUNTER — Ambulatory Visit (INDEPENDENT_AMBULATORY_CARE_PROVIDER_SITE_OTHER): Payer: BC Managed Care – PPO | Admitting: Thoracic Surgery

## 2011-11-06 ENCOUNTER — Encounter: Payer: Self-pay | Admitting: Thoracic Surgery

## 2011-11-06 ENCOUNTER — Ambulatory Visit
Admission: RE | Admit: 2011-11-06 | Discharge: 2011-11-06 | Disposition: A | Discharge status: Home / Self Care | Payer: BC Managed Care – PPO | Source: Ambulatory Visit | Attending: Thoracic Surgery | Admitting: Thoracic Surgery

## 2011-11-06 DIAGNOSIS — J90 Pleural effusion, not elsewhere classified: Secondary | ICD-10-CM

## 2011-11-06 DIAGNOSIS — J9383 Other pneumothorax: Secondary | ICD-10-CM

## 2011-11-06 DIAGNOSIS — J942 Hemothorax: Secondary | ICD-10-CM

## 2011-11-06 DIAGNOSIS — S21309A Unspecified open wound of unspecified front wall of thorax with penetration into thoracic cavity, initial encounter: Secondary | ICD-10-CM

## 2011-11-06 DIAGNOSIS — Z09 Encounter for follow-up examination after completed treatment for conditions other than malignant neoplasm: Secondary | ICD-10-CM
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
## 2013-07-05 IMAGING — CR DG CHEST 1V PORT
1 series · 1 of 1 positions shown · non-contrast
Comparison: 04/22/2011

CLINICAL DATA: Follow up hemothorax

PORTABLE CHEST - 1 VIEW

[AP]
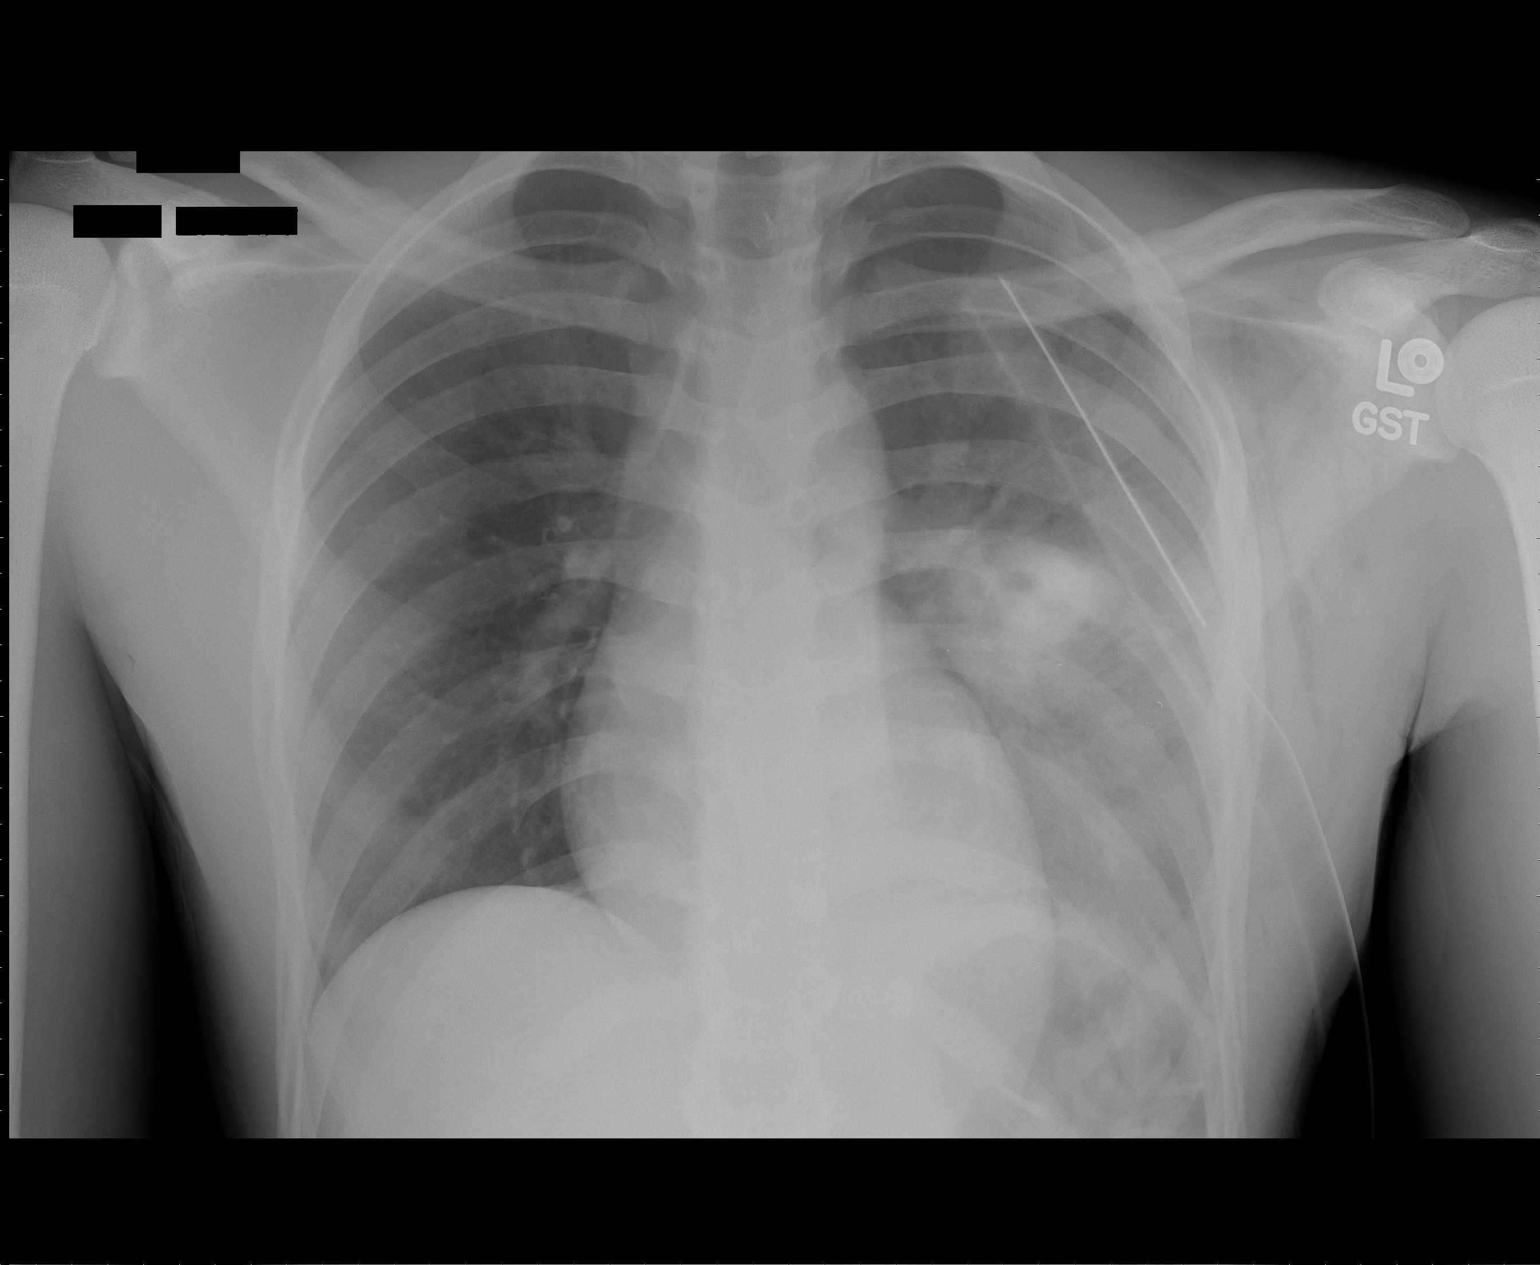

[1 of 1 positions shown; findings below may reference images not displayed]

FINDINGS: There is a left chest tube in place.  A small left apical
pneumothorax is identified which measures approximately 5 mm in
thickness.

Cavitary process within the left midlung is unchanged from previous
exam.

Right lung is clear.
IMPRESSION: 1.  Left-sided chest tube with small left apical pneumothorax.
2.  No significant change in cavitary airspace consolidation in the
left midlung.
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
## 2013-07-07 IMAGING — CR DG CHEST 1V PORT
1 series · 1 of 1 positions shown · non-contrast
Comparison: 04/24/2011

CLINICAL DATA: Evaluate chest tube

PORTABLE CHEST - 1 VIEW

[view not recorded]
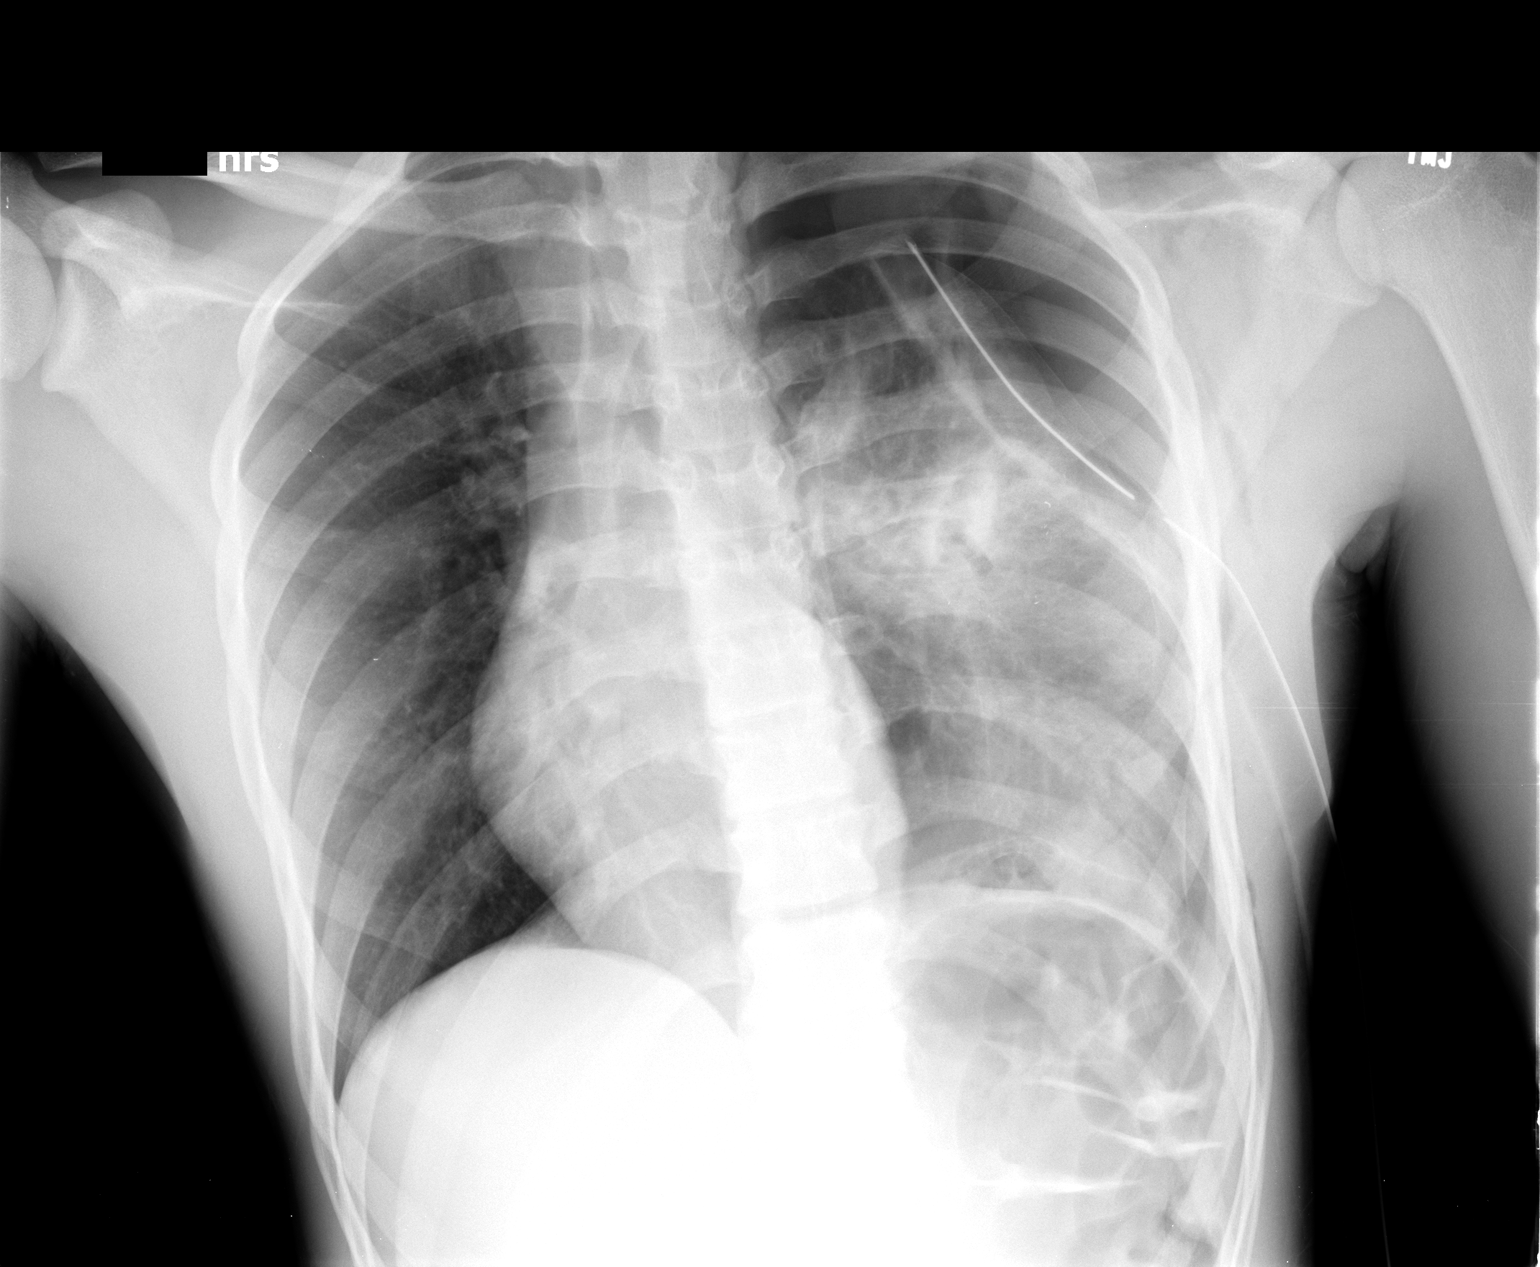

[1 of 1 positions shown; findings below may reference images not displayed]

FINDINGS: Left-sided chest tube is in place.

There is been interval increase in volume of left pneumothorax.
This has a thickness measuring approximately 4.1 cm.  There is
associated rightward shift of the mediastinum.

Right lung is clear.
IMPRESSION: 1.  Increasing size of the left pneumothorax with associated
rightward  shift of the mediastinum.  Cannot rule out tension
pneumothorax

## 2013-07-08 IMAGING — CR DG CHEST 1V PORT
1 series · 1 of 1 positions shown · non-contrast
Comparison: Portable exam 9676 hours compared to 3856 hours

CLINICAL DATA: Postop right VATS

PORTABLE CHEST - 1 VIEW

[view not recorded]
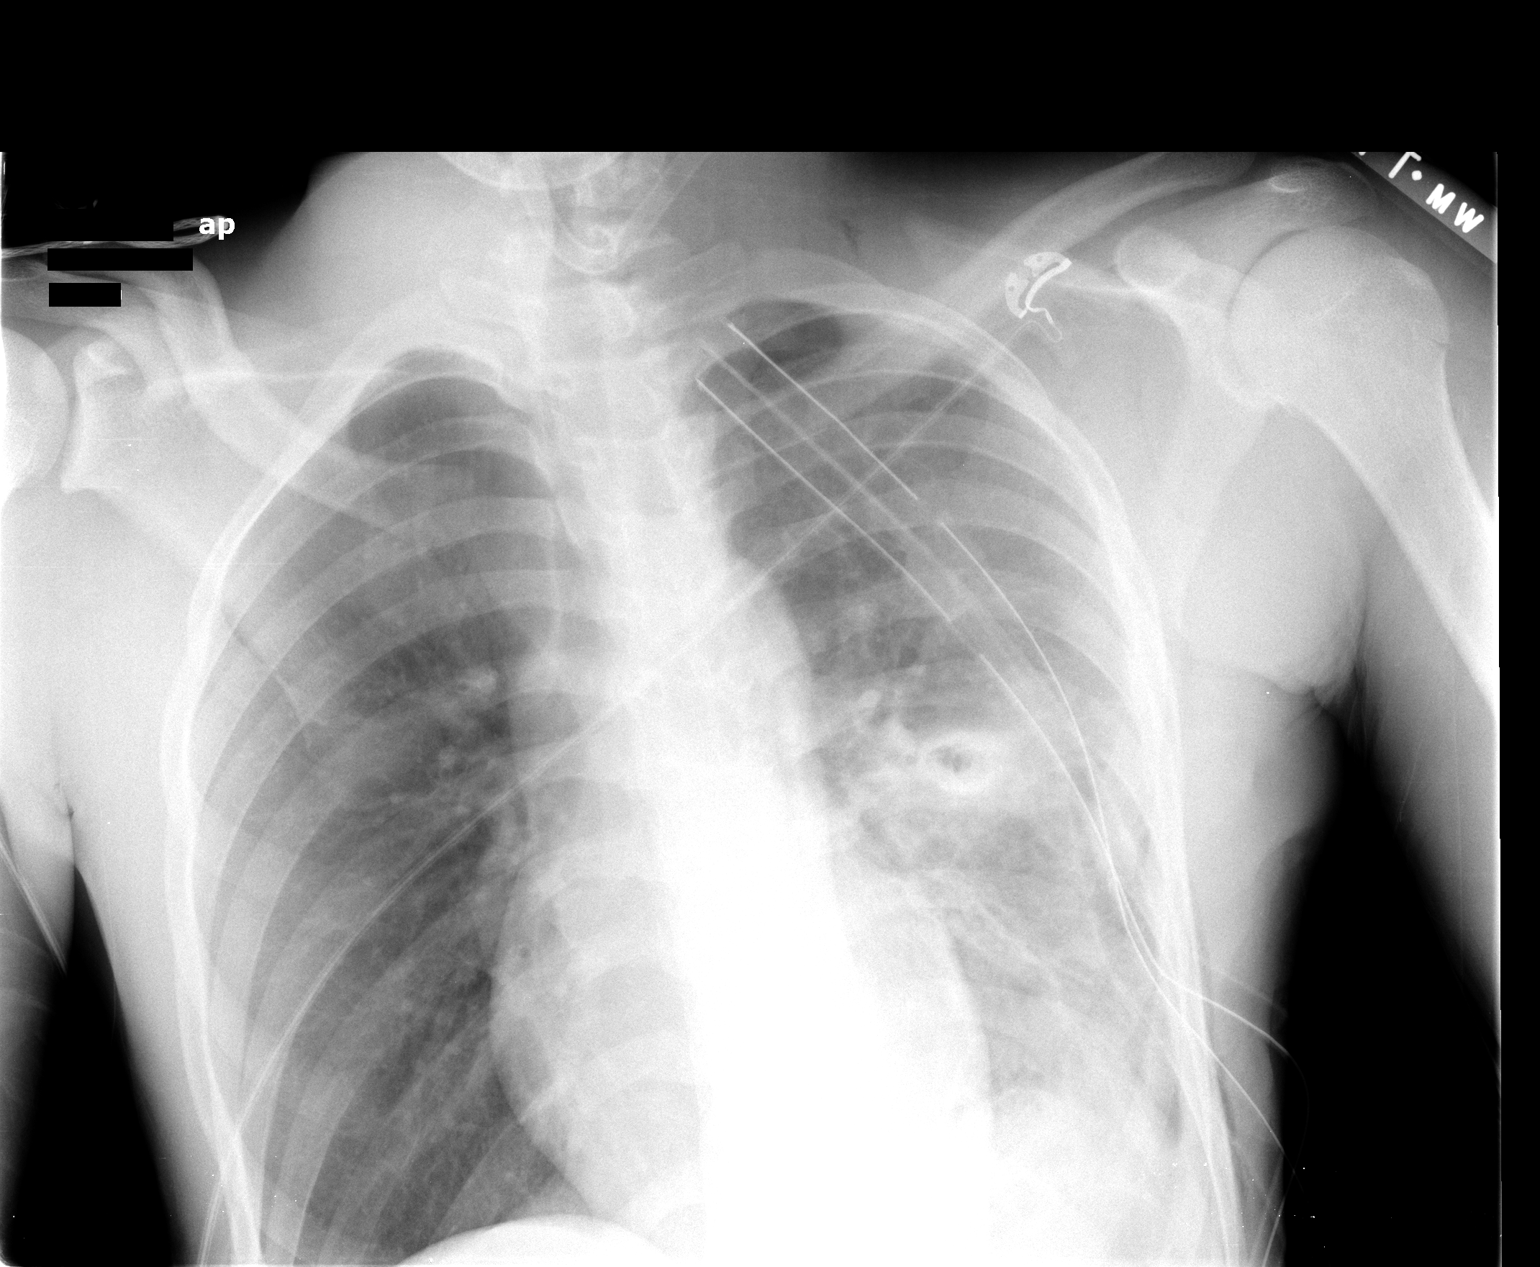

[1 of 1 positions shown; findings below may reference images not displayed]

FINDINGS: Second left thoracostomy tube placed.
Near complete resolution of pneumothorax seen on previous exam.
Hazy opacity in mid-to-lower left lung has increased since previous
exam question infiltrate or atelectasis.
Right lung remains clear.
Minimal central peribronchial thickening.
Bones unremarkable.
IMPRESSION: Tiny residual left apex pneumothorax following placement of second
left thoracostomy tube.
Increased infiltrate versus atelectasis left lower lobe.

## 2013-09-21 ENCOUNTER — Encounter (HOSPITAL_COMMUNITY): Payer: Self-pay | Admitting: Emergency Medicine

## 2013-09-21 ENCOUNTER — Emergency Department (HOSPITAL_COMMUNITY)
Admission: EM | Admit: 2013-09-21 | Discharge: 2013-09-21 | Disposition: A | Discharge status: Home / Self Care | Payer: BC Managed Care – PPO | Attending: Emergency Medicine | Admitting: Emergency Medicine

## 2013-09-21 DIAGNOSIS — K0889 Other specified disorders of teeth and supporting structures: Secondary | ICD-10-CM

## 2013-09-21 DIAGNOSIS — K089 Disorder of teeth and supporting structures, unspecified: Secondary | ICD-10-CM | POA: Insufficient documentation

## 2013-09-21 DIAGNOSIS — K029 Dental caries, unspecified: Secondary | ICD-10-CM | POA: Insufficient documentation

## 2013-09-21 MED ORDER — PENICILLIN V POTASSIUM 500 MG PO TABS: 500.0000 mg | ORAL_TABLET | Freq: Four times a day (QID) | ORAL | Status: AC

## 2013-09-21 MED ORDER — KETOROLAC TROMETHAMINE 30 MG/ML IJ SOLN
30.0000 mg | Freq: Once | INTRAMUSCULAR | Status: AC
  Administered 2013-09-21: 30 mg via INTRAMUSCULAR
  Filled 2013-09-21: qty 1

## 2013-09-21 MED ORDER — HYDROCODONE-ACETAMINOPHEN 5-325 MG PO TABS
1.0000 | ORAL_TABLET | Freq: Once | ORAL | Status: AC
  Administered 2013-09-21: 1 via ORAL
  Filled 2013-09-21: qty 1

## 2013-09-21 MED ORDER — HYDROCODONE-ACETAMINOPHEN 5-325 MG PO TABS: 1.0000 | ORAL_TABLET | Freq: Four times a day (QID) | ORAL | Status: DC | PRN

## 2013-09-21 NOTE — ED Notes (Signed)
The patient said he started having dental pain tonight at about 2000hrs.  He said he thinks he has a "hole" in one of his teeth on the bottom.  Right now he rates his pain 8/10 both top and bottom teeth and it is radiating to the right ear.  The patient said he took tylenol and goody powders and nothing works.  He decided to come to the ED to be evaluated.

## 2013-09-21 NOTE — ED Provider Notes (Signed)
CSN: [id number]     Arrival date & time 09/21/13  [id number] History   First MD Initiated Contact with Patient 09/21/13 0448     Chief Complaint  Patient presents with  . Dental Pain     (Consider location/radiation/quality/duration/timing/severity/associated sxs/prior Treatment) Patient is a 23 y.o. male presenting with tooth pain.  Dental Pain Associated symptoms: no fever     Patient presents with dental pain. He has no significant past medical history. Patient reports onset of pain since 8:00 yesterday. He has taken Aleve and Tylenol without any improvement. He rates his pain 8/10. Pain radiates into his right jaw.  He denies any fevers or chills. He has not seen a dentist since he was a teenager.  Patient denies any difficulty breathing or sore throat.  History reviewed. No pertinent past medical history. Past Surgical History  Procedure Laterality Date  . Left vats,fiberoptic bronch  04/26/11   History reviewed. No pertinent family history. History  Substance Use Topics  . Smoking status: Never Smoker   . Smokeless tobacco: Not on file  . Alcohol Use: No    Review of Systems  Constitutional: Negative for fever and chills.  HENT: Positive for dental problem. Negative for sore throat.       Allergies  Review of patient's allergies indicates no known allergies.  Home Medications   Current Outpatient Rx  Name  Route  Sig  Dispense  Refill  . acetaminophen (TYLENOL) 325 MG tablet   Oral   Take 650 mg by mouth every 6 (six) hours as needed for mild pain.         Marland Kitchen. HYDROcodone-acetaminophen (NORCO/VICODIN) 5-325 MG per tablet   Oral   Take 1 tablet by mouth every 6 (six) hours as needed for moderate pain.   10 tablet   0   . penicillin v potassium (VEETID) 500 MG tablet   Oral   Take 1 tablet (500 mg total) by mouth 4 (four) times daily.   40 tablet   0    BP 111/71  Pulse 66  Temp(Src) 97.7 F (36.5 C) (Oral)  Resp 18  SpO2 100% Physical Exam  Nursing  note and vitals reviewed. Constitutional: He is oriented to person, place, and time. He appears well-developed and well-nourished.  HENT:  Head: Normocephalic and atraumatic.  Mouth/Throat: Oropharynx is clear and moist.  Multiple dental caries, no obvious abscess, no tenderness to palpation along the jawline, no significant swelling, no trismus  Neck: Neck supple.  Cardiovascular: Normal rate and regular rhythm.   Pulmonary/Chest: Effort normal. No respiratory distress.  Lymphadenopathy:    He has no cervical adenopathy.  Neurological: He is alert and oriented to person, place, and time.  Skin: Skin is warm and dry.  Psychiatric: He has a normal mood and affect.    ED Course  Procedures (including critical care time) Labs Review Labs Reviewed - No data to display Imaging Review No results found.   EKG Interpretation None      MDM   Final diagnoses:  Pain, dental    Patient presents with dental pain. No signs of abscess or deep space infection. Discussed with patient need to followup with dentist. Maryclare LabradorWe'll give resources. Patient was given ibuprofen and Norco. He will also be given penicillin.  After history, exam, and medical workup I feel the patient has been appropriately medically screened and is safe for discharge home. Pertinent diagnoses were discussed with the patient. Patient was given return precautions.  Shon Baton, MD 09/21/13 [phone]

## 2013-09-21 NOTE — Discharge Instructions (Signed)
Dental Pain Toothache is pain in or around a tooth. It may get worse with chewing or with cold or heat.  HOME CARE  Your dentist may use a numbing medicine during treatment. If so, you may need to avoid eating until the medicine wears off. Ask your dentist about this.  Only take medicine as told by your dentist or doctor.  Avoid chewing food near the painful tooth until after all treatment is done. Ask your dentist about this. GET HELP RIGHT AWAY IF:   The problem gets worse or new problems appear.  You have a fever.  There is redness and puffiness (swelling) of the face, jaw, or neck.  You cannot open your mouth.  There is pain in the jaw.  There is very bad pain that is not helped by medicine. MAKE SURE YOU:   Understand these instructions.  Will watch your condition.  Will get help right away if you are not doing well or get worse. Document Released: 11/27/2007 Document Revised: 09/02/2011 Document Reviewed: 11/27/2007 [facility] Patient Information 2014 [place], [place].   Emergency Department Resource Guide 1) Find a Doctor and Pay Out of Pocket Although you won't have to find out who is covered by your insurance plan, it is a good idea to ask around and get recommendations. You will then need to call the office and see if the doctor you have chosen will accept you as a new patient and what types of options they offer for patients who are self-pay. Some doctors offer discounts or will set up payment plans for their patients who do not have insurance, but you will need to ask so you aren't surprised when you get to your appointment.  2) Contact Your Local Health Department Not all health departments have doctors that can see patients for sick visits, but many do, so it is worth a call to see if yours does. If you don't know where your local health department is, you can check in your phone book. The CDC also has a tool to help you locate your state's health department, and many  state websites also have listings of all of their local health departments.  3) Find a [facility] If your illness is not likely to be very severe or complicated, you may want to try a walk in clinic. These are popping up all over the country in pharmacies, drugstores, and shopping centers. They're usually staffed by nurse practitioners or physician assistants that have been trained to treat common illnesses and complaints. They're usually fairly quick and inexpensive. However, if you have serious medical issues or chronic medical problems, these are probably not your best option.  No Primary Care Doctor: - Call Health Connect at  [phone] - they can help you locate a primary care doctor that  accepts your insurance, provides certain services, etc. - Physician Referral Service- [phone]  Chronic Pain Problems: Organization         Address  Phone   Notes  [facility]  [phone] Patients need to be referred by their primary care doctor.   Medication Assistance: Organization         Address  Phone   Notes  [facility] [address] [place],  [address] [phone] --Must be a resident of [facility] -- Must have NO insurance coverage whatsoever (no Medicaid/ Medicare, etc.) -- The pt. MUST have a primary care doctor that directs their care regularly and follows  them in the community   [facility]  [phone]   [employer]  [phone]    Agencies that provide inexpensive medical care: Organization         Address  Phone   Notes  [place]  [phone]   [facility]    [phone]   [facility] [place], [place] [address] [phone]   [place] [address], [place] [phone]   [facility]anned Parenthood    [phone]   [facility]    [phone]   [place] and  [address], [place] Phone:  [phone], Fax:  [phone] Hours of Operation:  9 am - 6 pm, M-F.  Also accepts Medicaid/Medicare and self-pay.  [facility] for [address], [place] Phone: [phone], Fax: [phone]. Hours of Operation:  8:30 am - 5:30 pm, M-F.  Also accepts Medicaid and self-pay.  [facility] [place] [address] [place] Phone: [phone]   [place], [place], [place] [phone], Ext. [phone] Mondays & Thursdays: 7-9 AM.  First 15 patients are seen on a first come, first serve basis.    [place] Providers:  Organization         Address  Phone   Notes  [facility] [address], [place] [phone] Also accepts self-pay patients.  [facility] [address], [place]  [phone]   [place], [address], [place] [phone]   [facility] [address], [place] [phone]   Lucianne Lei [address], [place]   [phone] Only accepts [place] patients after they have their name applied to their card.   Self-Pay (no insurance) in [facility]:  Organization         Address  Phone   Notes  Sickle Cell Patients, [facility] [place] [phone]   [facility] [place] [phone]   [facility] [place]  [place], [place], [place] [phone]   [facility]/Dr. Osei-Bonsu  [address] [place] [address], [place] or [address], [place] [phone] Phone number for both Institute and [place] locations is the same.  [facility] [address], [place] [phone]   [facility] [address] [place] or [address] [phone]   [facility] [address], [place] [phone], phone; [phone], fax Sees patients 1st and 3rd Saturday of every month.  Must not qualify for public or private insurance (i.e. Medicaid, Medicare, [place] Health Choice, Veterans' Benefits)  Household income should be no more than 200% of the poverty level The clinic cannot treat you if you are pregnant or think you are pregnant  Sexually transmitted diseases are not treated at the clinic.    Dental Care: Organization         Address  Phone  Notes  [facility] [address] [place], [place] [phone] Accepts children up to age 15 who are enrolled  in Medicaid or [place] Health Choice; pregnant women with a Medicaid card; and children who have applied for Medicaid or [place] Health Choice, but were declined, whose parents can pay a reduced fee at time of service.  °[facility] [place]  [address], [place] ([phone]) [phone] Accepts children up to age 21 who are enrolled in Medicaid or [place] Health Choice; pregnant women with a Medicaid card; and children who have applied for Medicaid or [place] Health Choice, but were declined, whose parents can pay a reduced fee at time of service.  °[facility] ° [address], [place] ([phone]) [phone] Patients are seen by appointment only. Walk-ins are not accepted. [facility] will see patients 18 years of age and older. °Monday - Tuesday (8am-5pm) °Most Wednesdays (8:30-5pm) °$30 per visit, cash only  °[facility] ° [address], [place] ([phone]) [phone] Patients are seen by appointment only. Walk-ins are not accepted. [facility] will see patients 18 years of age and older. °One Wednesday Evening (Monthly: Volunteer Based).  $30 per visit, cash only  °[facility]  [phone] for adults; Children under age 4, call Graduate Pediatric Dentistry at [phone]. Children aged 4-14, please call [phone] to request a pediatric application. ° Dental services are provided in all areas of dental care including fillings, crowns and bridges, complete and partial dentures, implants, gum treatment, root canals, and extractions. Preventive care is also provided. Treatment is provided to both adults and children. °Patients are selected via a lottery and there is often a waiting list. °  °[facility] [address], °[place] ° (336) [phone] [email or web address] °  °[facility] [address], [place], [place] ([phone], Ext. [phone] Second and Fourth Thursday of each month, opens at 6:30 AM; Clinic ends at 9 AM.  Patients are seen on a first-come first-served basis, and a limited number are seen during each clinic.  ° °[facility] ° [address], [place], [place] ([phone]) [phone]   Eligibility Requirements °You must have lived in [place], [place], or [place] counties for at least the last three months. °  You cannot be eligible for state or federal sponsored healthcare insurance, including Veterans Administration, Medicaid, or Medicare. °  You generally cannot be eligible for healthcare insurance through your employer.  °  How to apply: °Eligibility screenings are held every Tuesday and Wednesday afternoon from 1:00 pm until 4:00 pm. You do not need an appointment for the interview!  °[facility] [address], [place], [place] [phone]   °[facility]  336-342-8273   °[facility]  [phone]   °[facility]  336-570-6415   ° °Behavioral Health Resources in the Community: °Intensive Outpatient Programs °Organization         Address  Phone  Notes  °[facility] [address], [place], [place]  336-878-6098   °[facility] [address], [place], [place] [phone]   °[facility] [address], [place], [place] ° 336-882-2125   °[facility] [address],  °[place], [place] 1-800-853-5163 or 336-641-4981   °Substance Abuse Resources °Organization         Address  Phone  Notes  °[facility]  336-882-2125   °[facility]  336-784-9470   °  [facility]  336-285-9073   °[facility]  336-845-3988   °Residential & Outpatient Substance Abuse Program  1-800-659-3381   °Psychological Services °Organization         Address  Phone  Notes  °[facility]  336- 832-9600   °[facility]  336- 378-7881   °[facility] [address], [place] 1-800-853-5163 or 336-641-4981   ° °Mobile Crisis Teams °Organization         Address  Phone  Notes  °[facility], Mobile Crisis Care Unit  1-877-626-1772   °Assertive °[facility] ° [address] [place], [place] 336-834-9664   °Sharon DeEsch [address] °[place] 336-554-5454   ° °Self-Help/Support Groups °Organization         Address  Phone             Notes  °[facility] [place] - variety of support groups  336- 373-1402 Call for more information  °Narcotics Anonymous (NA), [facility] [address], °[place]  2 meetings at this location  ° °Residential Treatment Programs °Organization         Address  Phone  Notes  °[facility] [address],    °[place]  1-866-801-8205   °[facility] ° [address], [place], [place] [phone]   °[facility] [address], [place] 336-845-3988 Admissions: 8am-3pm M-F  °[facility] [address]    °[place], [place] 336-841-1104   °[facility] [address], [place], [place] 336-379-7146   °[facility] [address]    °[place], [place] 336-285-9073   °[facility] - Intensive Outpatient [address], [place], [place] 336-852-3033   °[facility] [address]  °[place], [place] 1-877-615-2722 or 336-784-9470   °[facility] ([facility]) [address] [place], [place] 336-227-7417 Accepts Medicaid  °[facility] [address]  ° [place] 1-800-659-3381 Substance Abuse/Addiction Treatment  ° °[place] [facility] Resources °Organization         Address  Phone  Notes  °[facility]  (888) 581-9988   °Julie Brannon, PhD [address] [place], [place]   (336) 349-5553 or (336) 951-0000   °[facility]   [address] °[place], [place] (336) 349-4454   °[facility] [address], [place], [place] (336) 342-8316 Insurance/Medicaid/sponsorship through [facility]  °[facility] [address]                                    [place], [place] (336) 342-8316 Therapy/tele-psych/case  °[facility] [address]  ° [place], [place] (336) 349-2233    °Dr. Arfeen  (336) 349-4544   °[facility] [place]  [employer] [facility] 1) [address], [place] °2) [address], [place] °3)  [address], [place] (336) 349-3220 °(336) 342-7768 ° °(336) 342-8140   °[place] Child Abuse [facility] (336) 342-1394 or (336) 342-3537 (After Hours)    ° ° ° °

## 2018-05-12 ENCOUNTER — Other Ambulatory Visit: Payer: Self-pay

## 2018-05-12 ENCOUNTER — Ambulatory Visit (HOSPITAL_COMMUNITY)
Admission: EM | Admit: 2018-05-12 | Discharge: 2018-05-12 | Disposition: A | Discharge status: Home / Self Care | Payer: Self-pay | Attending: Family Medicine | Admitting: Family Medicine

## 2018-05-12 ENCOUNTER — Encounter (HOSPITAL_COMMUNITY): Payer: Self-pay

## 2018-05-12 DIAGNOSIS — F410 Panic disorder [episodic paroxysmal anxiety] without agoraphobia: Secondary | ICD-10-CM

## 2018-05-12 DIAGNOSIS — F4310 Post-traumatic stress disorder, unspecified: Secondary | ICD-10-CM

## 2018-05-12 MED ORDER — FLUOXETINE HCL 20 MG PO TABS: 20.0000 mg | ORAL_TABLET | Freq: Every day | ORAL | 3 refills | Status: DC

## 2018-05-12 NOTE — Discharge Instructions (Addendum)
The only cure for this posttraumatic stress disorder and panic disorder is talking it out.  Otherwise, the anxiety will eat you will alive for the rest of your life.  Medicines can help blunt some of the symptoms and that is why I have prescribed fluoxetine.  I have given you couple names that you can consult for help.
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
## 2018-06-08 ENCOUNTER — Ambulatory Visit: Payer: Self-pay | Admitting: Family Medicine

## 2018-06-08 VITALS — BP 130/90 | HR 108 | Temp 98.3°F | Resp 16 | Wt 170.4 lb

## 2018-06-08 DIAGNOSIS — B9789 Other viral agents as the cause of diseases classified elsewhere: Secondary | ICD-10-CM

## 2018-06-08 DIAGNOSIS — J019 Acute sinusitis, unspecified: Secondary | ICD-10-CM

## 2018-06-08 MED ORDER — PSEUDOEPH-BROMPHEN-DM 30-2-10 MG/5ML PO SYRP: 10.0000 mL | ORAL_SOLUTION | Freq: Three times a day (TID) | ORAL | 0 refills | Status: DC | PRN

## 2018-06-08 MED ORDER — CETIRIZINE HCL 10 MG PO TABS: 10.0000 mg | ORAL_TABLET | Freq: Every day | ORAL | 0 refills | Status: DC

## 2018-06-08 NOTE — Progress Notes (Signed)
  Donald MainlandBrandon Sherman is a 27 y.o. male who presents today with concerns of one week of cough and congestion. He has taken aleve for his symptoms and report minimal relief of his headache symptoms. He denies any known sick contacts.  Review of Systems  Constitutional: Negative for chills, fever and malaise/fatigue.  HENT: Positive for ear pain and sore throat. Negative for congestion, ear discharge and sinus pain.   Eyes: Negative.   Respiratory: Positive for cough. Negative for sputum production and shortness of breath.   Cardiovascular: Negative.  Negative for chest pain.  Gastrointestinal: Negative for abdominal pain, diarrhea, nausea and vomiting.  Genitourinary: Negative for dysuria, frequency, hematuria and urgency.  Musculoskeletal: Negative for myalgias.  Skin: Negative.   Neurological: Negative for headaches.  Endo/Heme/Allergies: Negative.   Psychiatric/Behavioral: Negative.     O: Vitals:   06/08/18 1149  BP: 130/90  Pulse: (!) 108  Resp: 16  Temp: 98.3 F (36.8 C)  SpO2: 96%     Physical Exam Vitals signs reviewed.  Constitutional:      Appearance: He is well-developed. He is not toxic-appearing.  HENT:     Head: Normocephalic.     Right Ear: Hearing, ear canal and external ear normal. A middle ear effusion is present.     Left Ear: Hearing, ear canal and external ear normal. A middle ear effusion is present.     Nose: Rhinorrhea present.     Right Sinus: No maxillary sinus tenderness or frontal sinus tenderness.     Left Sinus: No maxillary sinus tenderness or frontal sinus tenderness.     Mouth/Throat:     Lips: Pink.     Mouth: Mucous membranes are moist.     Pharynx: Uvula midline.  Eyes:     General: Lids are normal.  Neck:     Musculoskeletal: Normal range of motion and neck supple.  Cardiovascular:     Rate and Rhythm: Normal rate and regular rhythm.     Pulses: Normal pulses.     Heart sounds: Normal heart sounds.  Pulmonary:     Effort: Pulmonary  effort is normal.     Breath sounds: Normal breath sounds.  Abdominal:     General: Bowel sounds are normal.     Palpations: Abdomen is soft.  Musculoskeletal: Normal range of motion.  Lymphadenopathy:     Head:     Right side of head: No submental or submandibular adenopathy.     Left side of head: No submental or submandibular adenopathy.     Cervical: No cervical adenopathy.  Neurological:     Mental Status: He is alert and oriented to person, place, and time.    A: 1. Acute viral sinusitis    P: Discussed exam findings, diagnosis etiology and medication use and indications reviewed with patient. Follow- Up and discharge instructions provided. No emergent/urgent issues found on exam.  Patient verbalized understanding of information provided and agrees with plan of care (POC), all questions answered.  1. Acute viral sinusitis - brompheniramine-pseudoephedrine-DM 30-2-10 MG/5ML syrup; Take 10 mLs by mouth 3 (three) times daily as needed. - cetirizine (ZYRTEC) 10 MG tablet; Take 1 tablet (10 mg total) by mouth daily.  Other orders - naproxen sodium (ALEVE) 220 MG tablet; Take 220 mg by mouth.  Discussed using medications for symptom relief- will consider abx if fever develops and symptoms persist beyond 7-10 days. As patient has not attempted to use anything for his congestion symptoms.

## 2018-06-08 NOTE — Patient Instructions (Signed)

## 2018-06-20 ENCOUNTER — Telehealth: Payer: Self-pay

## 2018-06-20 NOTE — Telephone Encounter (Signed)
I returned patient call but I was not able to contact the patient, I left a message in the voice mail, asking to call us back .

## 2018-07-01 ENCOUNTER — Ambulatory Visit (INDEPENDENT_AMBULATORY_CARE_PROVIDER_SITE_OTHER): Payer: BLUE CROSS/BLUE SHIELD | Admitting: Physician Assistant

## 2018-07-01 ENCOUNTER — Encounter: Payer: Self-pay | Admitting: Physician Assistant

## 2018-07-01 VITALS — BP 150/80 | HR 104 | Temp 98.6°F | Ht 71.0 in | Wt 173.0 lb

## 2018-07-01 DIAGNOSIS — R030 Elevated blood-pressure reading, without diagnosis of hypertension: Secondary | ICD-10-CM

## 2018-07-01 DIAGNOSIS — F4310 Post-traumatic stress disorder, unspecified: Secondary | ICD-10-CM

## 2018-07-01 DIAGNOSIS — F419 Anxiety disorder, unspecified: Secondary | ICD-10-CM | POA: Diagnosis not present

## 2018-07-01 LAB — COMPREHENSIVE METABOLIC PANEL
ALK PHOS: 55 U/L (ref 39–117)
ALT: 17 U/L (ref 0–53)
AST: 15 U/L (ref 0–37)
Albumin: 4.6 g/dL (ref 3.5–5.2)
BILIRUBIN TOTAL: 0.5 mg/dL (ref 0.2–1.2)
BUN: 10 mg/dL (ref 6–23)
CO2: 30 meq/L (ref 19–32)
CREATININE: 0.95 mg/dL (ref 0.40–1.50)
Calcium: 10.1 mg/dL (ref 8.4–10.5)
Chloride: 102 mEq/L (ref 96–112)
GFR: 123.1 mL/min (ref >60.00)
Glucose, Bld: 96 mg/dL (ref 70–99)
Potassium: 4 mEq/L (ref 3.5–5.1)
SODIUM: 140 meq/L (ref 135–145)
TOTAL PROTEIN: 7.8 g/dL (ref 6.0–8.3)

## 2018-07-01 LAB — CBC WITH DIFFERENTIAL/PLATELET
BASOS ABS: 0 10*3/uL (ref 0.0–0.1)
Basophils Relative: 0.9 % (ref 0.0–3.0)
EOS ABS: 0.2 10*3/uL (ref 0.0–0.7)
Eosinophils Relative: 5.4 % — ABNORMAL HIGH (ref 0.0–5.0)
HCT: 43.2 % (ref 39.0–52.0)
Hemoglobin: 14.4 g/dL (ref 13.0–17.0)
LYMPHS ABS: 1.4 10*3/uL (ref 0.7–4.0)
Lymphocytes Relative: 38.6 % (ref 12.0–46.0)
MCHC: 33.3 g/dL (ref 30.0–36.0)
MCV: 87.7 fl (ref 78.0–100.0)
MONO ABS: 0.3 10*3/uL (ref 0.1–1.0)
MONOS PCT: 9.4 % (ref 3.0–12.0)
NEUTROS ABS: 1.6 10*3/uL (ref 1.4–7.7)
Neutrophils Relative %: 45.7 % (ref 43.0–77.0)
PLATELETS: 254 10*3/uL (ref 150.0–400.0)
RBC: 4.93 Mil/uL (ref 4.22–5.81)
RDW: 14.7 % (ref 11.5–15.5)
WBC: 3.5 10*3/uL — ABNORMAL LOW (ref 4.0–10.5)

## 2018-07-01 LAB — TSH: TSH: 1.98 u[IU]/mL (ref 0.35–4.50)

## 2018-07-01 MED ORDER — SERTRALINE HCL 25 MG PO TABS: 25.0000 mg | ORAL_TABLET | Freq: Every day | ORAL | 1 refills | Status: DC

## 2018-07-01 NOTE — Patient Instructions (Signed)
It was great to see you!  Start Zoloft 25 mg daily. Please tell someone you are starting this medication. If you develop suicidal thoughts while on this medication, please tell someone you trust and go to the ER.  I really think you would benefit from seeing a therapist, please consider finding one, see the list that we provided.   We will contact you with your lab results.  Let's follow-up in 2 weeks to re-check your blood pressure, sooner if you have concerns.  Take care,  Jarold MottoSamantha Lily Kernen PA-C
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
## 2018-12-04 ENCOUNTER — Ambulatory Visit (INDEPENDENT_AMBULATORY_CARE_PROVIDER_SITE_OTHER): Payer: Medicaid Other | Admitting: Physician Assistant

## 2018-12-04 ENCOUNTER — Encounter: Payer: Self-pay | Admitting: Physician Assistant

## 2018-12-04 ENCOUNTER — Ambulatory Visit: Payer: Self-pay

## 2018-12-04 DIAGNOSIS — F419 Anxiety disorder, unspecified: Secondary | ICD-10-CM

## 2018-12-04 DIAGNOSIS — R030 Elevated blood-pressure reading, without diagnosis of hypertension: Secondary | ICD-10-CM
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
## 2018-12-04 NOTE — Patient Instructions (Signed)
It was great to see you!  Please look at the list and try to make an appointment with a counselor. If you have any issues, please let me know.  Let's follow-up in 1 month, sooner if you have concerns.  Take care,  Inda Coke PA-C

## 2019-01-25 ENCOUNTER — Ambulatory Visit (INDEPENDENT_AMBULATORY_CARE_PROVIDER_SITE_OTHER): Payer: Medicaid Other | Admitting: Physician Assistant

## 2019-01-25 ENCOUNTER — Other Ambulatory Visit: Payer: Self-pay

## 2019-01-25 ENCOUNTER — Encounter: Payer: Self-pay | Admitting: Physician Assistant

## 2019-01-25 VITALS — BP 156/88 | HR 110 | Temp 98.0°F | Ht 71.0 in | Wt 167.0 lb

## 2019-01-25 DIAGNOSIS — R030 Elevated blood-pressure reading, without diagnosis of hypertension: Secondary | ICD-10-CM | POA: Diagnosis not present

## 2019-01-25 DIAGNOSIS — H6981 Other specified disorders of Eustachian tube, right ear: Secondary | ICD-10-CM | POA: Diagnosis not present

## 2019-01-25 DIAGNOSIS — F419 Anxiety disorder, unspecified: Secondary | ICD-10-CM | POA: Diagnosis not present

## 2019-01-25 MED ORDER — PROPRANOLOL HCL 20 MG PO TABS
20.0000 mg | ORAL_TABLET | Freq: Three times a day (TID) | ORAL | 0 refills | Status: AC | PRN
Start: 2019-01-25 — End: ?

## 2019-01-25 MED ORDER — CETIRIZINE HCL 10 MG PO TABS: 10.0000 mg | ORAL_TABLET | Freq: Every day | ORAL | 1 refills | Status: AC

## 2019-01-25 NOTE — Patient Instructions (Signed)
It was great to see you!  1. Restart zyrtec (i've sent this in)  2. Continue seeing your therapist  3. I have sent in propranolol in for you to trial as needed for anxiety or palpitations (the other medication that we discussed was Atarax/Hydroxyzine -- if you would like this instead, please give me a call.)  Let's follow-up in 1 month, sooner if you have concerns.  Take care,  Inda Coke PA-C
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
## 2024-02-20 ENCOUNTER — Other Ambulatory Visit: Payer: Self-pay

## 2024-02-20 ENCOUNTER — Emergency Department (HOSPITAL_BASED_OUTPATIENT_CLINIC_OR_DEPARTMENT_OTHER)
Admission: EM | Admit: 2024-02-20 | Discharge: 2024-02-20 | Disposition: A | Discharge status: Home / Self Care | Attending: Emergency Medicine | Admitting: Emergency Medicine

## 2024-02-20 ENCOUNTER — Encounter (HOSPITAL_BASED_OUTPATIENT_CLINIC_OR_DEPARTMENT_OTHER): Payer: Self-pay | Admitting: Emergency Medicine

## 2024-02-20 ENCOUNTER — Emergency Department (HOSPITAL_BASED_OUTPATIENT_CLINIC_OR_DEPARTMENT_OTHER)

## 2024-02-20 DIAGNOSIS — Y92410 Unspecified street and highway as the place of occurrence of the external cause: Secondary | ICD-10-CM | POA: Diagnosis not present

## 2024-02-20 DIAGNOSIS — S8002XA Contusion of left knee, initial encounter: Secondary | ICD-10-CM | POA: Diagnosis not present

## 2024-02-20 DIAGNOSIS — S8992XA Unspecified injury of left lower leg, initial encounter: Secondary | ICD-10-CM | POA: Diagnosis present

## 2024-02-20 MED ORDER — ACETAMINOPHEN 500 MG PO TABS
1000.0000 mg | ORAL_TABLET | Freq: Once | ORAL | Status: AC
  Administered 2024-02-20: 1000 mg via ORAL
  Filled 2024-02-20: qty 2
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
